# Patient Record
Sex: Female | Born: 1955 | Race: Black or African American | Hispanic: No | Marital: Single | Smoking: Never smoker
Health system: Southern US, Community
[De-identification: ages and names within clinical notes are randomized; demographics above are authoritative.]

## PROBLEM LIST (undated history)

## (undated) DIAGNOSIS — E079 Disorder of thyroid, unspecified: Secondary | ICD-10-CM

## (undated) DIAGNOSIS — M1990 Unspecified osteoarthritis, unspecified site: Secondary | ICD-10-CM

## (undated) HISTORY — PX: ABDOMINAL HYSTERECTOMY: SHX81

---

## 1998-12-29 ENCOUNTER — Other Ambulatory Visit: Admission: RE | Admit: 1998-12-29 | Discharge: 1998-12-29 | Payer: Self-pay | Admitting: Obstetrics and Gynecology

## 1999-02-18 ENCOUNTER — Ambulatory Visit (HOSPITAL_COMMUNITY): Admission: RE | Admit: 1999-02-18 | Discharge: 1999-02-18 | Payer: Self-pay | Admitting: Obstetrics and Gynecology

## 1999-02-18 ENCOUNTER — Encounter: Payer: Self-pay | Admitting: Obstetrics and Gynecology

## 1999-02-22 ENCOUNTER — Inpatient Hospital Stay (HOSPITAL_COMMUNITY): Admission: RE | Admit: 1999-02-22 | Discharge: 1999-02-24 | Payer: Self-pay | Admitting: Obstetrics and Gynecology

## 2000-04-19 ENCOUNTER — Emergency Department (HOSPITAL_COMMUNITY): Admission: EM | Admit: 2000-04-19 | Discharge: 2000-04-19 | Payer: Self-pay | Admitting: *Deleted

## 2000-04-27 ENCOUNTER — Emergency Department (HOSPITAL_COMMUNITY): Admission: EM | Admit: 2000-04-27 | Discharge: 2000-04-27 | Payer: Self-pay | Admitting: Emergency Medicine

## 2001-04-10 ENCOUNTER — Emergency Department (HOSPITAL_COMMUNITY): Admission: EM | Admit: 2001-04-10 | Discharge: 2001-04-10 | Payer: Self-pay | Admitting: Emergency Medicine

## 2004-03-19 ENCOUNTER — Emergency Department (HOSPITAL_COMMUNITY): Admission: EM | Admit: 2004-03-19 | Discharge: 2004-03-19 | Payer: Self-pay | Admitting: Family Medicine

## 2004-07-18 DIAGNOSIS — H0520 Unspecified exophthalmos: Secondary | ICD-10-CM | POA: Insufficient documentation

## 2004-10-28 ENCOUNTER — Ambulatory Visit: Payer: Self-pay | Admitting: Family Medicine

## 2004-11-11 ENCOUNTER — Ambulatory Visit: Payer: Self-pay | Admitting: Family Medicine

## 2004-11-17 ENCOUNTER — Ambulatory Visit: Payer: Self-pay | Admitting: *Deleted

## 2004-12-01 ENCOUNTER — Ambulatory Visit: Payer: Self-pay | Admitting: Family Medicine

## 2004-12-22 ENCOUNTER — Ambulatory Visit: Payer: Self-pay | Admitting: Family Medicine

## 2005-01-31 ENCOUNTER — Encounter (HOSPITAL_COMMUNITY): Admission: RE | Admit: 2005-01-31 | Discharge: 2005-05-01 | Payer: Self-pay | Admitting: Family Medicine

## 2005-12-17 ENCOUNTER — Emergency Department (HOSPITAL_COMMUNITY): Admission: EM | Admit: 2005-12-17 | Discharge: 2005-12-17 | Payer: Self-pay | Admitting: Emergency Medicine

## 2005-12-22 ENCOUNTER — Ambulatory Visit: Payer: Self-pay | Admitting: Family Medicine

## 2006-01-01 ENCOUNTER — Ambulatory Visit: Payer: Self-pay | Admitting: Family Medicine

## 2006-01-12 ENCOUNTER — Ambulatory Visit: Payer: Self-pay | Admitting: Family Medicine

## 2006-02-23 ENCOUNTER — Ambulatory Visit: Payer: Self-pay | Admitting: Family Medicine

## 2006-02-23 LAB — CONVERTED CEMR LAB
HCT: 37.5 %
Hemoglobin: 11.8 g/dL
LDL Cholesterol: 109 mg/dL
MCV: 95.4 fL
VLDL: 20 mg/dL
WBC: 8.3 10*3/uL

## 2006-02-28 ENCOUNTER — Ambulatory Visit (HOSPITAL_COMMUNITY): Admission: RE | Admit: 2006-02-28 | Discharge: 2006-02-28 | Payer: Self-pay | Admitting: Family Medicine

## 2006-12-31 ENCOUNTER — Ambulatory Visit: Payer: Self-pay | Admitting: Family Medicine

## 2007-08-01 DIAGNOSIS — F1010 Alcohol abuse, uncomplicated: Secondary | ICD-10-CM | POA: Insufficient documentation

## 2007-08-01 DIAGNOSIS — E0500 Thyrotoxicosis with diffuse goiter without thyrotoxic crisis or storm: Secondary | ICD-10-CM | POA: Insufficient documentation

## 2007-08-01 DIAGNOSIS — E785 Hyperlipidemia, unspecified: Secondary | ICD-10-CM

## 2007-08-01 DIAGNOSIS — R87610 Atypical squamous cells of undetermined significance on cytologic smear of cervix (ASC-US): Secondary | ICD-10-CM

## 2007-08-01 DIAGNOSIS — L259 Unspecified contact dermatitis, unspecified cause: Secondary | ICD-10-CM

## 2007-08-01 DIAGNOSIS — D6489 Other specified anemias: Secondary | ICD-10-CM | POA: Insufficient documentation

## 2007-08-01 DIAGNOSIS — E039 Hypothyroidism, unspecified: Secondary | ICD-10-CM | POA: Insufficient documentation

## 2007-08-07 ENCOUNTER — Encounter (INDEPENDENT_AMBULATORY_CARE_PROVIDER_SITE_OTHER): Payer: Self-pay | Admitting: *Deleted

## 2007-11-22 ENCOUNTER — Telehealth (INDEPENDENT_AMBULATORY_CARE_PROVIDER_SITE_OTHER): Payer: Self-pay | Admitting: Internal Medicine

## 2007-12-02 ENCOUNTER — Ambulatory Visit: Payer: Self-pay | Admitting: Family Medicine

## 2007-12-06 ENCOUNTER — Ambulatory Visit (HOSPITAL_COMMUNITY): Admission: RE | Admit: 2007-12-06 | Discharge: 2007-12-06 | Payer: Self-pay | Admitting: Family Medicine

## 2007-12-09 LAB — CONVERTED CEMR LAB
ALT: 29 units/L (ref 0–35)
AST: 46 units/L — ABNORMAL HIGH (ref 0–37)
Alkaline Phosphatase: 89 units/L (ref 39–117)
BUN: 24 mg/dL — ABNORMAL HIGH (ref 6–23)
Basophils Absolute: 0 10*3/uL (ref 0.0–0.1)
Basophils Relative: 0 % (ref 0–1)
Calcium: 9.6 mg/dL (ref 8.4–10.5)
Chloride: 105 meq/L (ref 96–112)
Creatinine, Ser: 1.27 mg/dL — ABNORMAL HIGH (ref 0.40–1.20)
Eosinophils Absolute: 0.1 10*3/uL (ref 0.0–0.7)
MCHC: 31.3 g/dL (ref 30.0–36.0)
MCV: 91.4 fL (ref 78.0–100.0)
Monocytes Relative: 6 % (ref 3–12)
Neutro Abs: 4.7 10*3/uL (ref 1.7–7.7)
Neutrophils Relative %: 53 % (ref 43–77)
Potassium: 3.9 meq/L (ref 3.5–5.3)
RDW: 15.4 % (ref 11.5–15.5)

## 2008-01-31 ENCOUNTER — Encounter (INDEPENDENT_AMBULATORY_CARE_PROVIDER_SITE_OTHER): Payer: Self-pay | Admitting: Internal Medicine

## 2008-01-31 ENCOUNTER — Ambulatory Visit: Payer: Self-pay | Admitting: Internal Medicine

## 2008-01-31 LAB — CONVERTED CEMR LAB: Whiff Test: POSITIVE

## 2008-02-03 ENCOUNTER — Ambulatory Visit: Payer: Self-pay | Admitting: Internal Medicine

## 2008-02-06 DIAGNOSIS — K089 Disorder of teeth and supporting structures, unspecified: Secondary | ICD-10-CM | POA: Insufficient documentation

## 2008-02-08 ENCOUNTER — Encounter (INDEPENDENT_AMBULATORY_CARE_PROVIDER_SITE_OTHER): Payer: Self-pay | Admitting: Internal Medicine

## 2008-02-08 LAB — CONVERTED CEMR LAB
ALT: 19 units/L (ref 0–35)
AST: 21 units/L (ref 0–37)
Albumin: 4.4 g/dL (ref 3.5–5.2)
Alkaline Phosphatase: 102 units/L (ref 39–117)
Calcium: 8.9 mg/dL (ref 8.4–10.5)
Chlamydia, DNA Probe: NEGATIVE
Chloride: 102 meq/L (ref 96–112)
Collection Interval-CRCL: 24 hr
Creatinine 24 HR UR: 2020 mg/24hr — ABNORMAL HIGH (ref 700–1800)
Creatinine Clearance: 169 mL/min — ABNORMAL HIGH (ref 75–115)
Creatinine, Urine: 115.5 mg/dL
GC Probe Amp, Genital: NEGATIVE
LDL Cholesterol: 66 mg/dL (ref 0–99)
Potassium: 4.2 meq/L (ref 3.5–5.3)
Sodium: 139 meq/L (ref 135–145)
TSH: 19.926 microintl units/mL — ABNORMAL HIGH (ref 0.350–5.50)
Total Protein: 8.6 g/dL — ABNORMAL HIGH (ref 6.0–8.3)
Triglycerides: 61 mg/dL (ref <150)

## 2008-02-16 ENCOUNTER — Encounter (INDEPENDENT_AMBULATORY_CARE_PROVIDER_SITE_OTHER): Payer: Self-pay | Admitting: Internal Medicine

## 2008-03-19 ENCOUNTER — Telehealth (INDEPENDENT_AMBULATORY_CARE_PROVIDER_SITE_OTHER): Payer: Self-pay | Admitting: *Deleted

## 2008-06-09 ENCOUNTER — Telehealth (INDEPENDENT_AMBULATORY_CARE_PROVIDER_SITE_OTHER): Payer: Self-pay | Admitting: Internal Medicine

## 2008-07-24 ENCOUNTER — Ambulatory Visit: Payer: Self-pay | Admitting: Internal Medicine

## 2008-08-13 ENCOUNTER — Encounter (INDEPENDENT_AMBULATORY_CARE_PROVIDER_SITE_OTHER): Payer: Self-pay | Admitting: Internal Medicine

## 2009-03-26 ENCOUNTER — Ambulatory Visit: Payer: Self-pay | Admitting: Internal Medicine

## 2009-04-09 ENCOUNTER — Ambulatory Visit (HOSPITAL_COMMUNITY): Admission: RE | Admit: 2009-04-09 | Discharge: 2009-04-09 | Payer: Self-pay | Admitting: Internal Medicine

## 2009-10-20 ENCOUNTER — Telehealth (INDEPENDENT_AMBULATORY_CARE_PROVIDER_SITE_OTHER): Payer: Self-pay | Admitting: *Deleted

## 2009-12-31 ENCOUNTER — Ambulatory Visit: Payer: Self-pay | Admitting: Internal Medicine

## 2009-12-31 ENCOUNTER — Telehealth (INDEPENDENT_AMBULATORY_CARE_PROVIDER_SITE_OTHER): Payer: Self-pay | Admitting: Internal Medicine

## 2009-12-31 LAB — CONVERTED CEMR LAB: TSH: 1.249 microintl units/mL (ref 0.350–4.500)

## 2010-01-04 ENCOUNTER — Encounter (INDEPENDENT_AMBULATORY_CARE_PROVIDER_SITE_OTHER): Payer: Self-pay | Admitting: Internal Medicine

## 2010-01-04 LAB — CONVERTED CEMR LAB
ALT: 17 units/L (ref 0–35)
BUN: 17 mg/dL (ref 6–23)
CO2: 24 meq/L (ref 19–32)
Calcium: 9.8 mg/dL (ref 8.4–10.5)
Chloride: 103 meq/L (ref 96–112)
Cholesterol: 147 mg/dL (ref 0–200)
Creatinine, Ser: 0.66 mg/dL (ref 0.40–1.20)
Glucose, Bld: 89 mg/dL (ref 70–99)
Total CHOL/HDL Ratio: 2.2
Triglycerides: 63 mg/dL (ref <150)

## 2010-01-11 ENCOUNTER — Encounter (INDEPENDENT_AMBULATORY_CARE_PROVIDER_SITE_OTHER): Payer: Self-pay | Admitting: Internal Medicine

## 2010-02-27 ENCOUNTER — Telehealth (INDEPENDENT_AMBULATORY_CARE_PROVIDER_SITE_OTHER): Payer: Self-pay | Admitting: *Deleted

## 2010-03-15 ENCOUNTER — Ambulatory Visit: Payer: Self-pay | Admitting: Internal Medicine

## 2010-03-15 LAB — CONVERTED CEMR LAB
Basophils Absolute: 0 10*3/uL (ref 0.0–0.1)
Eosinophils Relative: 2 % (ref 0–5)
Lymphocytes Relative: 40 % (ref 12–46)
Lymphs Abs: 2.9 10*3/uL (ref 0.7–4.0)
MCHC: 31.2 g/dL (ref 30.0–36.0)
MCV: 89.5 fL (ref 78.0–100.0)
Neutro Abs: 3.8 10*3/uL (ref 1.7–7.7)
Platelets: 270 10*3/uL (ref 150–400)
RBC: 4.3 M/uL (ref 3.87–5.11)

## 2010-04-17 ENCOUNTER — Encounter (INDEPENDENT_AMBULATORY_CARE_PROVIDER_SITE_OTHER): Payer: Self-pay | Admitting: Internal Medicine

## 2010-05-03 ENCOUNTER — Ambulatory Visit (HOSPITAL_COMMUNITY): Admission: RE | Admit: 2010-05-03 | Discharge: 2010-05-03 | Payer: Self-pay | Admitting: Internal Medicine

## 2010-12-11 ENCOUNTER — Encounter: Payer: Self-pay | Admitting: Family Medicine

## 2010-12-20 NOTE — Letter (Signed)
Summary: *HSN Results Follow up  [facility]  [address] [place], [place] [address]   Phone: [phone]  Fax: [phone]      04/17/2010   Alison Harris [address] [place], [place]  [address]   Dear  [name],                            ____S.Drinkard,FNP   ____D. Gore,FNP       ____B. McPherson,MD   ____V. Rankins,MD    __X__E. Leslye Puccini,MD    ____N. Daphine Deutscher, FNP  ____D. Reche Dixon, MD    ____K. Philipp Deputy, MD    ____Other     This letter is to inform you that your recent test(s):  _______Pap Smear    ____X___Lab Test     _______X-ray    ___X____ is within acceptable limits  _______ requires a medication change  _______ requires a follow-up lab visit  _______ requires a follow-up visit with your provider   Comments:  Your bloodwork was fine.  You need to see me.  I thought you had an appt. in May.  May is over.       _________________________________________________________ If you have any questions, please contact our office                     Sincerely,  Julieanne Manson MD [facility]

## 2010-12-20 NOTE — Letter (Signed)
Summary: Lipid Letter  [facility]  [address] [place], [place] [address]   Phone: [phone]  Fax: [phone]    01/11/2010  Alison Harris [address] [place], [place]  [address]  Dear Alison Harris:  We have carefully reviewed your last lipid profile from 01/04/2010 and the results are noted below with a summary of recommendations for lipid management.    Cholesterol:       147     Goal: <200   HDL "good" Cholesterol:   67     Goal: >45   LDL "bad" Cholesterol:   67     Goal: <100   Triglycerides:       63     Goal: <15-    Cholesterol is excellent.  Stay on Crestor.  You should have labs done regarding you liver enzymes twice yearly and your cholesterol checked once yearly at least.  Please call and make sure you have a liver check (lab) in 6 months.    TLC Diet (Therapeutic Lifestyle Change): Saturated Fats & Transfatty acids should be kept < 7% of total calories ***Reduce Saturated Fats Polyunstaurated Fat can be up to 10% of total calories Monounsaturated Fat Fat can be up to 20% of total calories Total Fat should be no greater than 25-35% of total calories Carbohydrates should be 50-60% of total calories Protein should be approximately 15% of total calories Fiber should be at least 20-30 grams a day ***Increased fiber may help lower LDL Total Cholesterol should be < 200mg /day Consider adding plant stanol/sterols to diet (example: Benacol spread) ***A higher intake of unsaturated fat may reduce Triglycerides and Increase HDL    Adjunctive Measures (may lower LIPIDS and reduce risk of Heart Attack) include: Aerobic Exercise (20-30 minutes 3-4 times a week) Limit Alcohol Consumption Weight Reduction Aspirin 75-81 mg a day by mouth (if not allergic or contraindicated) Dietary Fiber 20-30 grams a day by mouth     Current Medications: 1)    Vistaril 25 Mg  Caps (Hydroxyzine pamoate) .... 1/2  to 1 capsule by mouth as needed for itching 2)    Crestor 10 Mg  Tabs (Rosuvastatin calcium) .... Take 1 tablet by mouth once a day 3)    Triamcinolone Acetonide 0.1 % Crea (Triamcinolone acetonide) .... Apply small amount to affected area and rub in well two times a day 4)    Metronidazole 500 Mg  Tabs (Metronidazole) .Marland Kitchen.. 1 tab by mouth two times a day for 7 days 5)    Synthroid 100 Mcg  Tabs (Levothyroxine sodium) .... Take 1 tablet by mouth once a day  will need thyroid  test in early july 2009.  If you have any questions, please call. We appreciate being able to work with you.   Sincerely,    [facility] Julieanne Manson MD

## 2010-12-20 NOTE — Progress Notes (Signed)
Summary: medication request   Phone Note Call from Patient   Summary of Call: pt came in for lab stating she is out of crestor and synthroid and she use [facility]... Initial call taken by: [place] Shannon,  December 31, 2009 8:21 AM  Follow-up for Phone Call        Pt. has been out of Crestor from what I can tell since beginning of 2010--please call her and find out when she last took her Crestor and please confirm her last fill from pharmacy. If she was indeed taking Crestor for the past month prior to blood draw on the 11th, please add CMET, FLP to those labs.    If she was not, please fill the Crestor for 2 months only and schedule her to come in in 6 weeks for FLP and CMET. I will fill thyroid hormone Follow-up by: Julieanne Manson MD,  January 03, 2010 1:46 PM  Additional Follow-up for Phone Call Additional follow up Details #1::        Left message on answering machine for pt to return call  Per Cathrine Muster las filled 01/26/2009. Additional Follow-up by: Vesta Mixer CMA,  January 04, 2010 10:21 AM    Additional Follow-up for Phone Call Additional follow up Details #2::    pt stated that she has been taking her crestor on a regular basis and has not missed any doses. she states that meds were being delivered directly to her home by the drug company. Did advised that we would add blood work and then provider would review labs to make any adjustments necessary and at that point we would contact pt to regarding refill information. pt notified that rx was sent to pharmacy for synthroid.. contacted [facility])CMET AND FLP ADDED TO PANEL.Marland KitchenMikey College CMA  January 04, 2010 11:42 AM   Sent fax to [facility] pharmacy to check if this is the case with Burna Mortimer.  Julieanne Manson MD  January 11, 2010 5:39 PM  Spoke with Wanda--pt. has been getting directly to her home.  Julieanne Manson MD  January 18, 2010 9:57 AM   Prescriptions: CRESTOR 10 MG TABS (ROSUVASTATIN CALCIUM) Take 1  tablet by mouth once a day  #30 x 11   Entered and Authorized by:   Julieanne Manson MD   Signed by:   Julieanne Manson MD on 01/18/2010   Method used:   Faxed to ...       [facility] (retail)       [address] [place], [place]  [address]       Ph: [phone]       Fax: [phone]   RxID:   [id number] CRESTOR 10 MG TABS (ROSUVASTATIN CALCIUM) Take 1 tablet by mouth once a day  #30 x 2   Entered and Authorized by:   Julieanne Manson MD   Signed by:   Julieanne Manson MD on 01/11/2010   Method used:   Faxed to ...       [facility] (retail)       [address] [place], [place]  [address]       Ph: [phone]       Fax: [phone]   RxID:   [id number] SYNTHROID 100 MCG  TABS (LEVOTHYROXINE SODIUM) Take 1 tablet by mouth once a day  Will need thyroid  test in early July 2009.  #30 x  11   Entered and Authorized by:   Julieanne Manson MD   Signed by:   Julieanne Manson MD on 01/03/2010   Method used:   Faxed to ...       [facility] (retail)       [address] [place], [place]  [address]       Ph: [phone]       Fax: [phone]   RxID:   [id number]

## 2010-12-20 NOTE — Progress Notes (Signed)
   Phone Note Outgoing Call   Summary of Call: Pt. needs to be seen at least in May--has not been seen since 03/2009.  Will need follow up CBC then--never followed up for that specifically after 5/10 visit as recommended. Initial call taken by: Julieanne Manson MD,  February 27, 2010 6:07 PM  Follow-up for Phone Call        Called pt. and had her schedule a CPP w/GK in May. Also, will f/u on CBC. ....Marland KitchenMarland KitchenMarland Kitchen Chauncy Passy SMA   February 28, 2010 10:33 AM

## 2011-03-28 ENCOUNTER — Other Ambulatory Visit (HOSPITAL_COMMUNITY): Payer: Self-pay | Admitting: Internal Medicine

## 2011-03-28 DIAGNOSIS — Z1231 Encounter for screening mammogram for malignant neoplasm of breast: Secondary | ICD-10-CM

## 2011-05-11 ENCOUNTER — Ambulatory Visit (HOSPITAL_COMMUNITY)
Admission: RE | Admit: 2011-05-11 | Discharge: 2011-05-11 | Disposition: A | Discharge status: Home / Self Care | Payer: Self-pay | Source: Ambulatory Visit | Attending: Internal Medicine | Admitting: Internal Medicine

## 2011-05-11 DIAGNOSIS — Z1231 Encounter for screening mammogram for malignant neoplasm of breast: Secondary | ICD-10-CM | POA: Insufficient documentation

## 2011-08-01 ENCOUNTER — Inpatient Hospital Stay (INDEPENDENT_AMBULATORY_CARE_PROVIDER_SITE_OTHER)
Admission: RE | Admit: 2011-08-01 | Discharge: 2011-08-01 | Disposition: A | Discharge status: Home / Self Care | Payer: Self-pay | Source: Ambulatory Visit | Attending: Emergency Medicine | Admitting: Emergency Medicine

## 2011-08-01 DIAGNOSIS — K047 Periapical abscess without sinus: Secondary | ICD-10-CM

## 2012-01-10 ENCOUNTER — Other Ambulatory Visit: Payer: Self-pay | Admitting: Family Medicine

## 2012-01-10 DIAGNOSIS — N644 Mastodynia: Secondary | ICD-10-CM

## 2012-01-26 ENCOUNTER — Other Ambulatory Visit: Payer: Self-pay | Admitting: Family Medicine

## 2012-01-26 ENCOUNTER — Ambulatory Visit
Admission: RE | Admit: 2012-01-26 | Discharge: 2012-01-26 | Disposition: A | Discharge status: Home / Self Care | Payer: Self-pay | Source: Ambulatory Visit | Attending: Family Medicine | Admitting: Family Medicine

## 2012-01-26 DIAGNOSIS — N644 Mastodynia: Secondary | ICD-10-CM

## 2012-01-26 IMAGING — MG MM DIAGNOSTIC BILATERAL
4 series · 4 of 4 positions shown · non-contrast
Comparison: [DATE], [DATE], dating back to [DATE].

CLINICAL DATA: Pain between the breasts.  History of benign
excisional biopsies of both breasts.

DIGITAL DIAGNOSTIC BILATERAL MAMMOGRAM WITH CAD

[R CC]
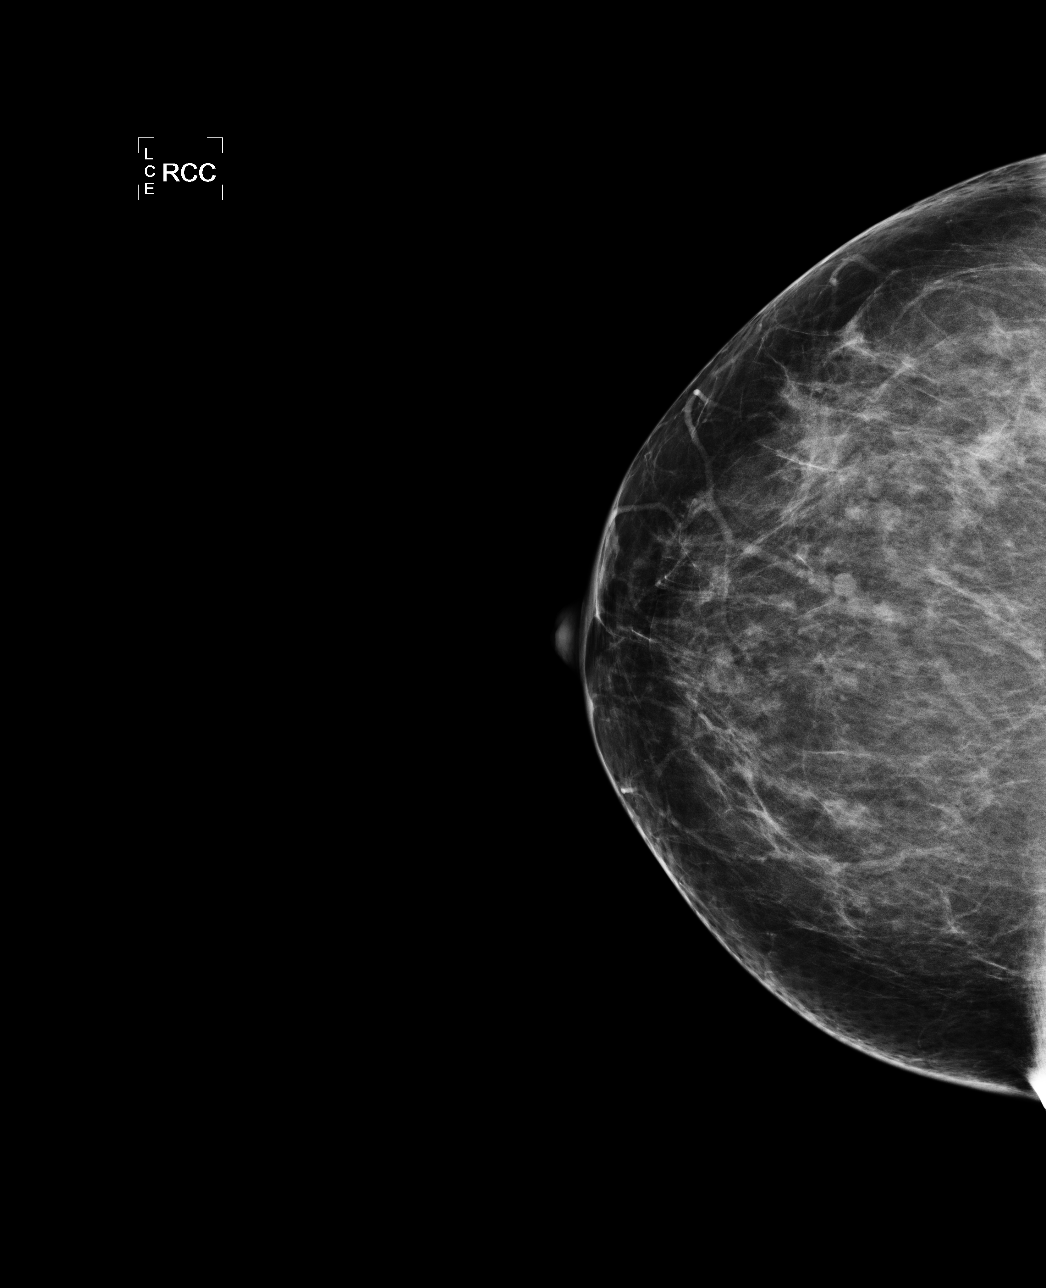

[L CC]
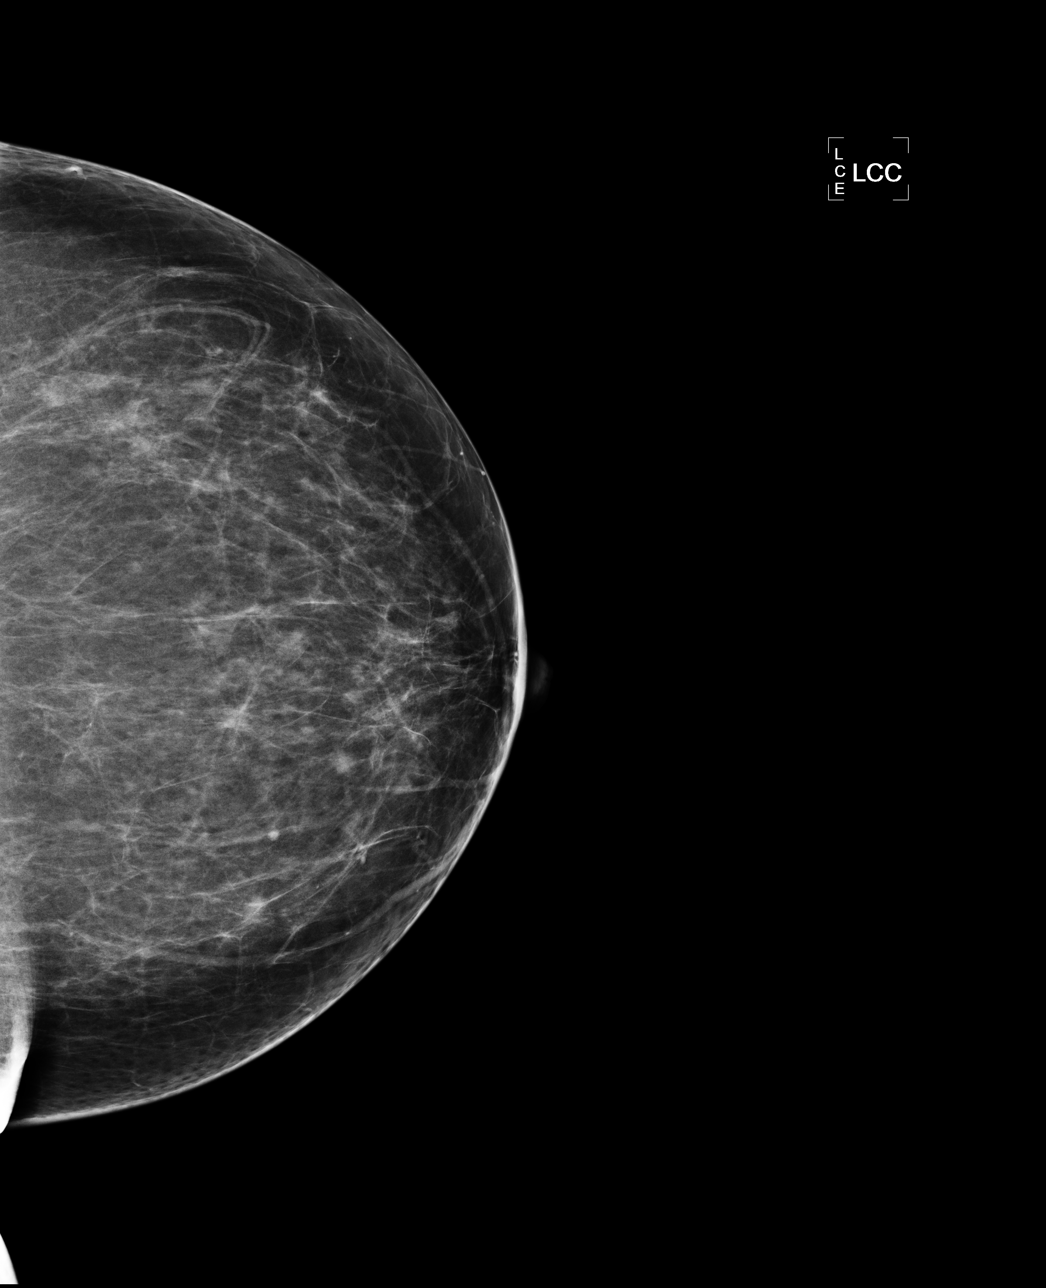

[L MLO]
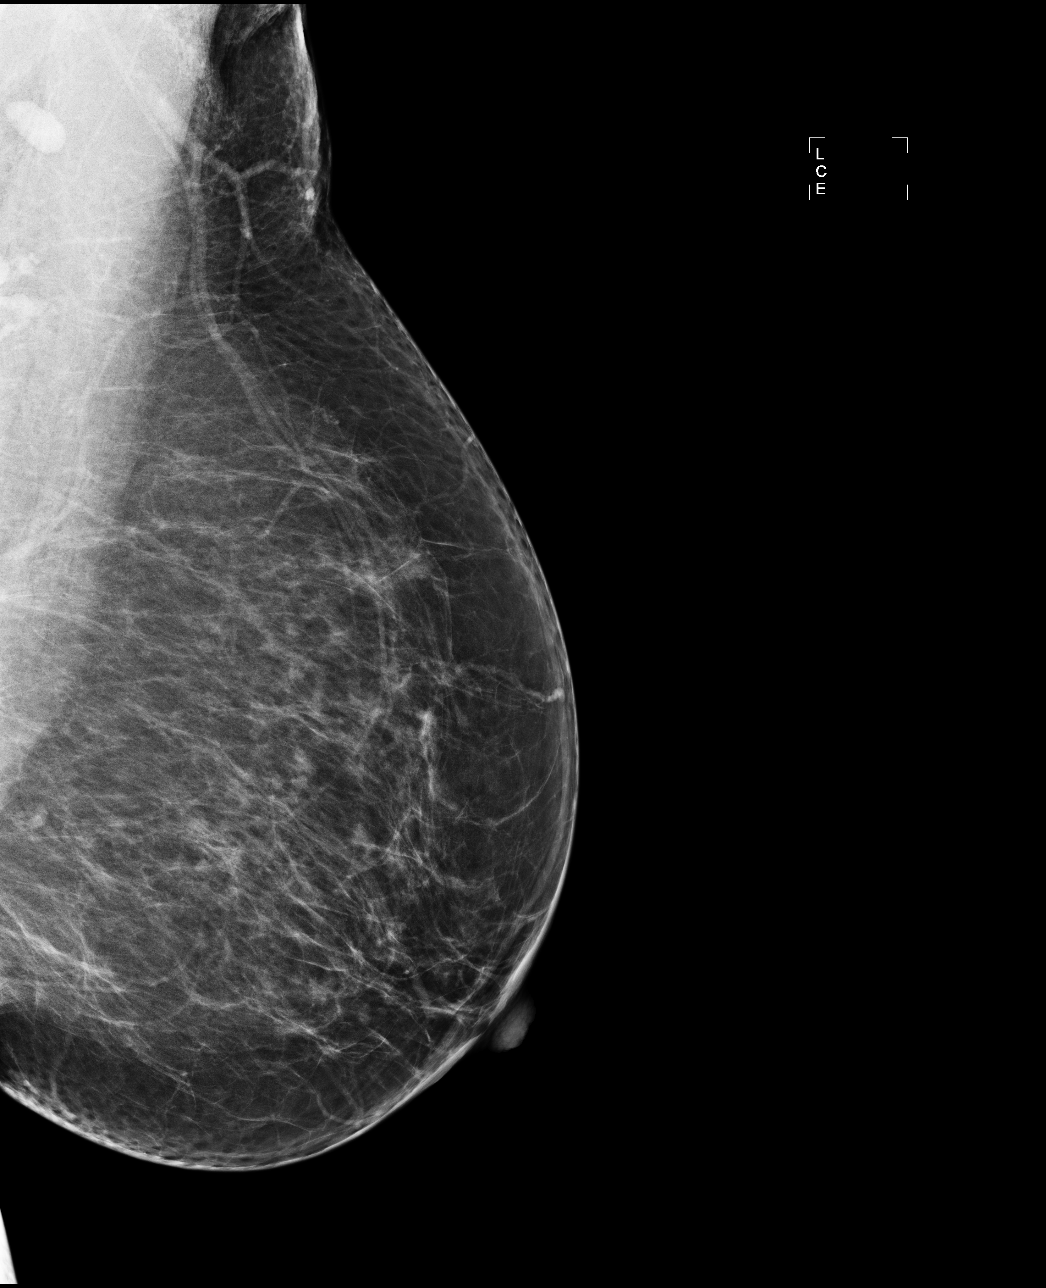

[R MLO]
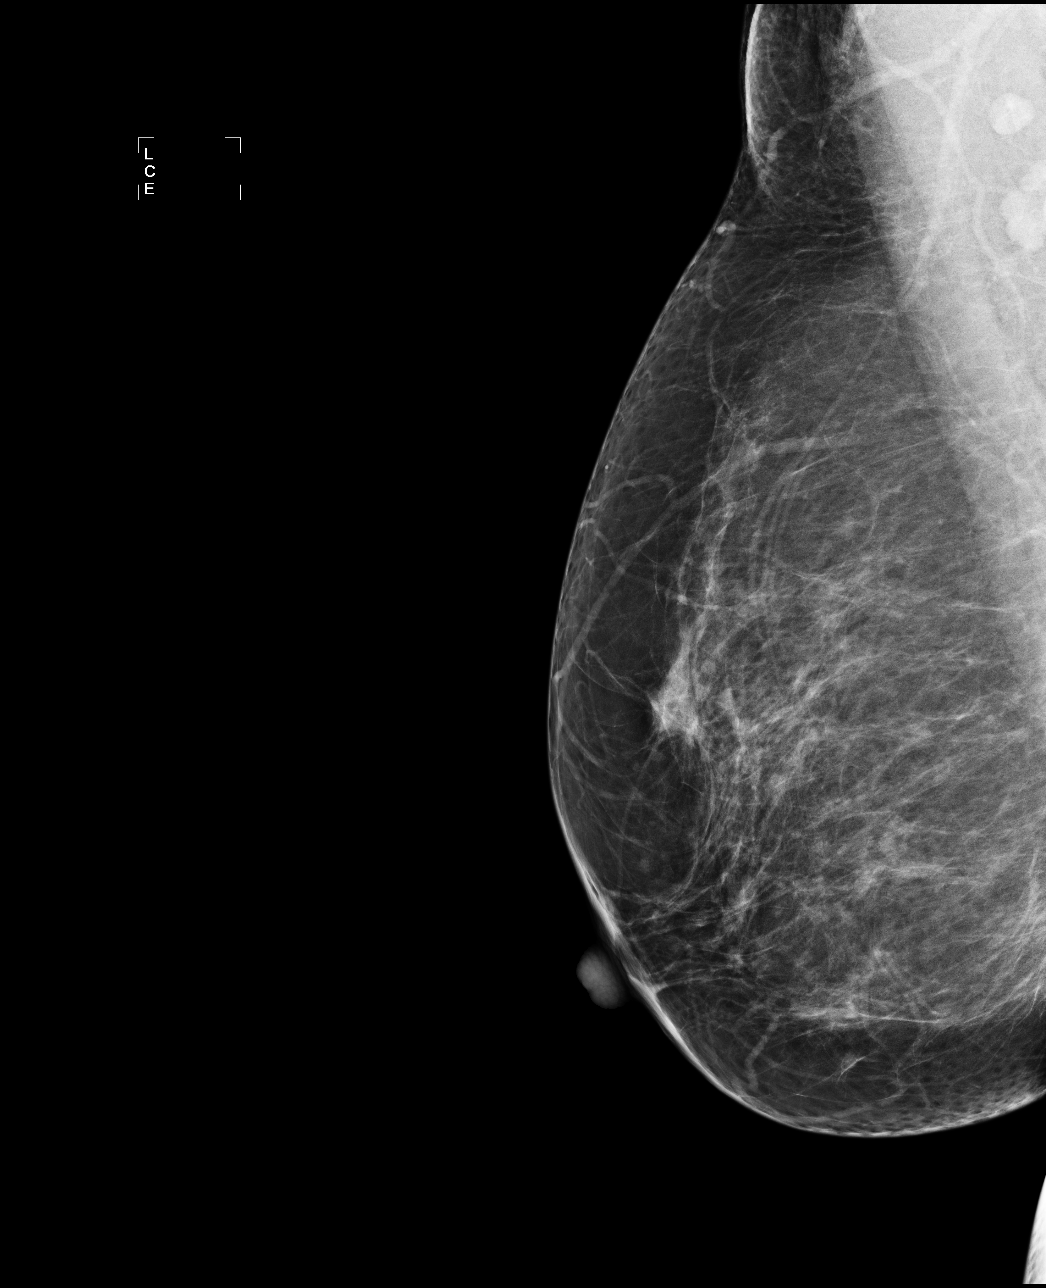

[4 of 4 positions shown; findings below may reference images not displayed]

FINDINGS: CC and MLO views of both breast were obtained.
Scattered fibroglandular parenchymal pattern.  No new suspicious
mass, nonsurgical architectural distortion, or suspicious
calcifications in either breast.
Mammographic images were processed with CAD.
IMPRESSION: No specific mammographic evidence of malignancy.

Recommendation:  Annual bilateral screening mammography in 1 year,
[DATE].  The patient was encouraged to perform monthly self
breast examination and communicate any changes with her primary
physician.  Findings and recommendations were discussed with the
patient and provided in written form at the time of the exam.

BI-RADS CATEGORY 1:  Negative.

## 2017-01-22 ENCOUNTER — Ambulatory Visit (HOSPITAL_COMMUNITY)
Admission: EM | Admit: 2017-01-22 | Discharge: 2017-01-22 | Disposition: A | Discharge status: Home / Self Care | Payer: BLUE CROSS/BLUE SHIELD | Attending: Family Medicine | Admitting: Family Medicine

## 2017-01-22 ENCOUNTER — Encounter (HOSPITAL_COMMUNITY): Payer: Self-pay | Admitting: Family Medicine

## 2017-01-22 DIAGNOSIS — K047 Periapical abscess without sinus: Secondary | ICD-10-CM

## 2017-01-22 HISTORY — DX: Disorder of thyroid, unspecified: E07.9

## 2017-01-22 MED ORDER — CLINDAMYCIN HCL 150 MG PO CAPS: 150.0000 mg | ORAL_CAPSULE | Freq: Four times a day (QID) | ORAL | 0 refills | Status: DC

## 2017-01-22 MED ORDER — IBUPROFEN 800 MG PO TABS: 800.0000 mg | ORAL_TABLET | Freq: Three times a day (TID) | ORAL | 0 refills | Status: DC | PRN

## 2017-01-22 NOTE — ED Provider Notes (Signed)
CSN: [id number]     Arrival date & time 01/22/17  1221 History   None    Chief Complaint  Patient presents with  . Oral Pain   (Consider location/radiation/quality/duration/timing/severity/associated sxs/prior Treatment)   Patient with pain in her mouth on right side. She has dental caries. Patient denies having the money to see the dentist. Denies any fevers, nausea, or vomiting. Indicates feeling a little bit of swelling        Past Medical History:  Diagnosis Date  . Thyroid disease    History reviewed. No pertinent surgical history. History reviewed. No pertinent family history. Social History  Substance Use Topics  . Smoking status: Current Every Day Smoker  . Smokeless tobacco: Never Used  . Alcohol use Not on file   OB History    No data available     Review of Systems  Allergies  Patient has no known allergies.  Home Medications   Prior to Admission medications   Medication Sig Start Date End Date Taking? Authorizing Provider  clindamycin (CLEOCIN) 150 MG capsule Take 1 capsule (150 mg total) by mouth 4 (four) times daily. 01/22/17   Atalya Dano Mayra ReelZahra Tenzin Edelman, MD  ibuprofen (ADVIL,MOTRIN) 800 MG tablet Take 1 tablet (800 mg total) by mouth every 8 (eight) hours as needed. 01/22/17   Darly Massi Mayra ReelZahra Arif Amendola, MD   Meds Ordered and Administered this Visit  Medications - No data to display  BP 131/79 (BP Location: Left Arm)   Pulse 70   Temp 98.8 F (37.1 C) (Oral)   Resp 16   SpO2 96%  No data found.   Physical Exam  Constitutional: She is oriented to person, place, and time. She appears well-developed and well-nourished.  HENT:  Head: Normocephalic.  Right Ear: External ear normal.  Left Ear: External ear normal.  Nose: Nose normal.  Mouth/Throat: Oropharynx is clear and moist. Dental abscesses and dental caries present.    Positive for lymphadenopathy   Eyes: Conjunctivae and EOM are normal. Pupils are equal, round, and reactive to light.  Neck: Normal  range of motion. Neck supple.  Cardiovascular: Normal rate, regular rhythm, normal heart sounds and intact distal pulses.   Pulmonary/Chest: Effort normal and breath sounds normal.  Abdominal: Soft. Bowel sounds are normal.  Musculoskeletal: Normal range of motion.  Neurological: She is alert and oriented to person, place, and time.  Skin: Skin is warm and dry.    Urgent Care Course     Procedures (including critical care time)  Labs Review Labs Reviewed - No data to display  Imaging Review No results found.      MDM   1. Dental abscess    61 year old female presenting for pain in  Mouth with multiple dental caries noted on exam. Patient states she has not been able to see a dentist due to overall cost. Slight swelling noted on right side of face compared to left. Therefore will provide patient with clindamycin for 14 days as well as ibuprofen 800 mg for pain. Follow-up with affordable dentures.      Berton BonAsiyah Zahra Jenisis Harmsen, MD 01/22/17 1258    Kelse Ploch Mayra ReelZahra Milta Croson, MD 01/22/17 [phone]

## 2017-01-22 NOTE — ED Triage Notes (Signed)
Pt here for pain in her gums.

## 2017-01-22 NOTE — Discharge Instructions (Signed)
Please take your clindamycin every 6 hours. For pain control you will need to take Ibuprofen every 8 hours as needed.

## 2018-12-19 ENCOUNTER — Ambulatory Visit (HOSPITAL_COMMUNITY)
Admission: EM | Admit: 2018-12-19 | Discharge: 2018-12-19 | Disposition: A | Discharge status: Home / Self Care | Payer: BLUE CROSS/BLUE SHIELD | Attending: Family Medicine | Admitting: Family Medicine

## 2018-12-19 ENCOUNTER — Ambulatory Visit (INDEPENDENT_AMBULATORY_CARE_PROVIDER_SITE_OTHER): Payer: BLUE CROSS/BLUE SHIELD

## 2018-12-19 ENCOUNTER — Encounter (HOSPITAL_COMMUNITY): Payer: Self-pay | Admitting: Emergency Medicine

## 2018-12-19 DIAGNOSIS — M25561 Pain in right knee: Secondary | ICD-10-CM

## 2018-12-19 MED ORDER — MELOXICAM 15 MG PO TABS: 15.0000 mg | ORAL_TABLET | Freq: Every day | ORAL | 1 refills | Status: DC

## 2018-12-19 NOTE — ED Triage Notes (Signed)
Pt c/o R knee pain for several months

## 2018-12-19 NOTE — Discharge Instructions (Addendum)
Return if any problems.  Schedule to see the Orthopaedist for evaluation °

## 2018-12-19 NOTE — ED Provider Notes (Signed)
[facility]    CSN: [id number] Arrival date & time: 12/19/18  1401     History   Chief Complaint Chief Complaint  Patient presents with  . Knee Pain    HPI Alison Harris is a 63 y.o. female.   The history is provided by the patient. No language interpreter was used.  Knee Pain  Location:  Knee Time since incident:  2 months Injury: no   Knee location:  R knee Pain details:    Quality:  Aching   Radiates to:  Does not radiate   Severity:  Mild   Onset quality:  Gradual   Duration:  2 months   Timing:  Constant Chronicity:  New Foreign body present:  No foreign bodies Relieved by:  Nothing Worsened by:  Nothing Ineffective treatments:  None tried Pt reports increased pain yesterday.  Pt reports leg feels like her leg will give away.   Past Medical History:  Diagnosis Date  . Thyroid disease     Patient Active Problem List   Diagnosis Date Noted  . DENTAL PAIN 02/06/2008  . GRAVE'S DISEASE 08/01/2007  . HYPOTHYROIDISM 08/01/2007  . DYSLIPIDEMIA 08/01/2007  . 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 ALCOHOL USE 08/01/2007  . ECZEMA 08/01/2007  . ASCUS PAP 08/01/2007  . EXOPHTHALMOS 07/18/2004    History reviewed. No pertinent surgical history.  OB History   No obstetric history on file.      Home Medications    Prior to Admission medications   Medication Sig Start Date End Date Taking? Authorizing Provider  ibuprofen (ADVIL,MOTRIN) 800 MG tablet Take 1 tablet (800 mg total) by mouth every 8 (eight) hours as needed. 01/22/17   Mikell, Antionette Poles, MD  meloxicam (MOBIC) 15 MG tablet Take 1 tablet (15 mg total) by mouth daily. 12/19/18 12/19/19  Elson Areas, PA-C    Family History No family history on file.  Social History Social History   Tobacco Use  . Smoking status: Current Every Day Smoker  . Smokeless tobacco: Never Used  Substance Use Topics  . Alcohol use: Not on file  . Drug use: Not on file     Allergies   Patient has no known  allergies.   Review of Systems Review of Systems  All other systems reviewed and are negative.    Physical Exam Triage Vital Signs ED Triage Vitals  Enc Vitals Group     BP 12/19/18 1509 (!) 102/57     Pulse Rate 12/19/18 1509 66     Resp 12/19/18 1509 18     Temp 12/19/18 1509 98.2 F (36.8 C)     Temp src --      SpO2 12/19/18 1509 97 %     Weight --      Height --      Head Circumference --      Peak Flow --      Pain Score 12/19/18 1510 6     Pain Loc --      Pain Edu? --      Excl. in GC? --    No data found.  Updated Vital Signs BP (!) 102/57   Pulse 66   Temp 98.2 F (36.8 C)   Resp 18   SpO2 97%   Visual Acuity Right Eye Distance:   Left Eye Distance:   Bilateral Distance:    Right Eye Near:   Left Eye Near:    Bilateral Near:     Physical Exam  Vitals signs and nursing note reviewed.  Constitutional:      Appearance: She is well-developed.  HENT:     Head: Normocephalic.     Right Ear: Tympanic membrane normal.     Left Ear: Tympanic membrane normal.     Nose: Nose normal.     Mouth/Throat:     Mouth: Mucous membranes are moist.  Eyes:     Pupils: Pupils are equal, round, and reactive to light.  Neck:     Musculoskeletal: Normal range of motion.  Cardiovascular:     Rate and Rhythm: Normal rate.     Pulses: Normal pulses.  Pulmonary:     Effort: Pulmonary effort is normal.  Abdominal:     General: There is no distension.  Musculoskeletal: Normal range of motion.  Skin:    General: Skin is warm.  Neurological:     General: No focal deficit present.     Mental Status: She is alert and oriented to person, place, and time.  Psychiatric:        Mood and Affect: Mood normal.      UC Treatments / Results  Labs (all labs ordered are listed, but only abnormal results are displayed) Labs Reviewed - No data to display  EKG None  Radiology Dg Knee Complete 4 Views Right  Result Date: 12/19/2018 CLINICAL DATA:  Acute right knee  pain without known injury. EXAM: RIGHT KNEE - COMPLETE 4+ VIEW COMPARISON:  None. FINDINGS: No evidence of fracture, dislocation, or joint effusion. No evidence of arthropathy or other focal bone abnormality. Soft tissues are unremarkable. IMPRESSION: Negative. Electronically Signed   By: Lupita Raider, M.D.   On: 12/19/2018 15:58    Procedures Procedures (including critical care time)  Medications Ordered in UC Medications - No data to display  Initial Impression / Assessment and Plan / UC Course  I have reviewed the triage vital signs and the nursing notes.  Pertinent labs & imaging results that were available during my care of the patient were reviewed by me and considered in my medical decision making (see chart for details).      Final Clinical Impressions(s) / UC Diagnoses   Final diagnoses:  Acute pain of right knee     Discharge Instructions     Return if any problems.  Schedule to see the Orthopaedist for evaluation   ED Prescriptions    Medication Sig Dispense Auth. Provider   meloxicam (MOBIC) 15 MG tablet Take 1 tablet (15 mg total) by mouth daily. 30 tablet Elson Areas, [place]     Controlled Substance Prescriptions [place] Controlled Substance Registry consulted? Not Applicable  An After Visit Summary was printed and given to the patient.    Elson Areas, [place] 12/19/18 2045

## 2019-07-28 ENCOUNTER — Encounter (HOSPITAL_COMMUNITY): Payer: Self-pay

## 2019-07-28 ENCOUNTER — Ambulatory Visit (HOSPITAL_COMMUNITY)
Admission: EM | Admit: 2019-07-28 | Discharge: 2019-07-28 | Disposition: A | Discharge status: Home / Self Care | Payer: BC Managed Care – PPO

## 2019-07-28 ENCOUNTER — Other Ambulatory Visit: Payer: Self-pay

## 2019-07-28 DIAGNOSIS — M79671 Pain in right foot: Secondary | ICD-10-CM

## 2019-07-28 HISTORY — DX: Unspecified osteoarthritis, unspecified site: M19.90

## 2019-07-28 MED ORDER — IBUPROFEN 800 MG PO TABS: 800.0000 mg | ORAL_TABLET | Freq: Three times a day (TID) | ORAL | 0 refills | Status: AC | PRN

## 2019-07-28 NOTE — ED Provider Notes (Signed)
[facility]    CSN: [id number] Arrival date & time: 07/28/19  [id number]      History   Chief Complaint Chief Complaint  Patient presents with  . Foot Pain    HPI Alison Harris is a 63 y.o. female.   Presents with right heel pain x3 days.  She denies falls, injury, new exercise, or other known cause for her pain.  She denies decreased sensation, paresthesias, weakness, or other symptoms.  The history is provided by the patient.    Past Medical History:  Diagnosis Date  . Arthritis   . Thyroid disease     Patient Active Problem List   Diagnosis Date Noted  . DENTAL PAIN 02/06/2008  . GRAVE'S DISEASE 08/01/2007  . HYPOTHYROIDISM 08/01/2007  . DYSLIPIDEMIA 08/01/2007  . 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 ALCOHOL USE 08/01/2007  . ECZEMA 08/01/2007  . ASCUS PAP 08/01/2007  . EXOPHTHALMOS 07/18/2004    History reviewed. No pertinent surgical history.  OB History   No obstetric history on file.      Home Medications    Prior to Admission medications   Medication Sig Start Date End Date Taking? Authorizing Provider  diclofenac (VOLTAREN) 75 MG EC tablet Take 75 mg by mouth 2 (two) times daily.   Yes [provider]  ibuprofen (ADVIL) 800 MG tablet Take 1 tablet (800 mg total) by mouth every 8 (eight) hours as needed. 07/28/19   Mickie Bailate, Shaden Higley H, NP  meloxicam (MOBIC) 15 MG tablet Take 1 tablet (15 mg total) by mouth daily. 12/19/18 12/19/19  Elson AreasSofia, Leslie K, PA-C    Family History Family History  Problem Relation Age of Onset  . Bronchitis Mother     Social History Social History   Tobacco Use  . Smoking status: Never Smoker  . Smokeless tobacco: Never Used  Substance Use Topics  . Alcohol use: Yes  . Drug use: Not on file     Allergies   Patient has no known allergies.   Review of Systems Review of Systems  Constitutional: Negative for chills and fever.  HENT: Negative for ear pain and sore throat.   Eyes: Negative for pain and visual  disturbance.  Respiratory: Negative for cough and shortness of breath.   Cardiovascular: Negative for chest pain and palpitations.  Gastrointestinal: Negative for abdominal pain and vomiting.  Genitourinary: Negative for dysuria and hematuria.  Musculoskeletal: Negative for arthralgias and back pain.  Skin: Negative for color change and rash.  Neurological: Negative for seizures, syncope, weakness and numbness.  All other systems reviewed and are negative.    Physical Exam Triage Vital Signs ED Triage Vitals  Enc Vitals Group     BP 07/28/19 1046 127/63     Pulse Rate 07/28/19 1046 (!) 57     Resp 07/28/19 1046 14     Temp 07/28/19 1046 98.1 F (36.7 C)     Temp Source 07/28/19 1046 Oral     SpO2 07/28/19 1046 99 %     Weight --      Height --      Head Circumference --      Peak Flow --      Pain Score 07/28/19 1042 7     Pain Loc --      Pain Edu? --      Excl. in GC? --    No data found.  Updated Vital Signs BP 127/63 (BP Location: Left Arm)   Pulse (!) 57   Temp  98.1 F (36.7 C) (Oral)   Resp 14   SpO2 99%   Visual Acuity Right Eye Distance:   Left Eye Distance:   Bilateral Distance:    Right Eye Near:   Left Eye Near:    Bilateral Near:     Physical Exam Vitals signs and nursing note reviewed.  Constitutional:      General: She is not in acute distress.    Appearance: She is well-developed.  HENT:     Head: Normocephalic and atraumatic.  Eyes:     Conjunctiva/sclera: Conjunctivae normal.  Neck:     Musculoskeletal: Neck supple.  Cardiovascular:     Rate and Rhythm: Normal rate and regular rhythm.     Heart sounds: No murmur.  Pulmonary:     Effort: Pulmonary effort is normal. No respiratory distress.     Breath sounds: Normal breath sounds.  Abdominal:     Palpations: Abdomen is soft.     Tenderness: There is no abdominal tenderness.  Musculoskeletal:        General: Tenderness present. No swelling or deformity.       Feet:  Skin:     General: Skin is warm and dry.     Capillary Refill: Capillary refill takes less than 2 seconds.     Findings: No bruising, erythema, lesion or rash.  Neurological:     General: No focal deficit present.     Mental Status: She is alert and oriented to person, place, and time.     Sensory: No sensory deficit.     Motor: No weakness.     Gait: Gait normal.      UC Treatments / Results  Labs (all labs ordered are listed, but only abnormal results are displayed) Labs Reviewed - No data to display  EKG   Radiology No results found.  Procedures Procedures (including critical care time)  Medications Ordered in UC Medications - No data to display  Initial Impression / Assessment and Plan / UC Course  I have reviewed the triage vital signs and the nursing notes.  Pertinent labs & imaging results that were available during my care of the patient were reviewed by me and considered in my medical decision making (see chart for details).    Right heel pain.  Treating with ibuprofen.  Instructed patient to wear the Ace wrap and, Ortho shoe as needed for comfort.  Instructed her to rest and elevate her foot as she is able.  Discussed that she can use ice packs 2-3 times a day for up to 20 minutes.  Instructed patient to follow-up with orthopedic if her pain continues.  Patient agrees with plan of care.     Final Clinical Impressions(s) / UC Diagnoses   Final diagnoses:  Pain of right heel     Discharge Instructions     Take ibuprofen as prescribed as needed.    Wear the Ace wrap and use the Ortho shoe as needed for comfort.    Rest and elevate your foot.  You can apply ice packs 2-3 times a day for up to 20 minutes.    Follow-up with the orthopedic listed if your pain continues.        ED Prescriptions    Medication Sig Dispense Auth. Provider   ibuprofen (ADVIL) 800 MG tablet Take 1 tablet (800 mg total) by mouth every 8 (eight) hours as needed. 30 tablet Sharion Balloon,  NP     Controlled Substance Prescriptions [place] Controlled Substance  Registry consulted? Not Applicable   Mickie Bail, NP 07/28/19 1106

## 2019-07-28 NOTE — ED Triage Notes (Signed)
Pt report having pain in the right heel for 3 days.

## 2019-07-28 NOTE — Discharge Instructions (Signed)
Take ibuprofen as prescribed as needed.    Wear the Ace wrap and use the Ortho shoe as needed for comfort.    Rest and elevate your foot.  You can apply ice packs 2-3 times a day for up to 20 minutes.    Follow-up with the orthopedic listed if your pain continues.

## 2019-07-31 ENCOUNTER — Ambulatory Visit: Payer: BC Managed Care – PPO | Admitting: Orthopaedic Surgery

## 2019-09-24 ENCOUNTER — Ambulatory Visit (HOSPITAL_COMMUNITY)
Admission: EM | Admit: 2019-09-24 | Discharge: 2019-09-24 | Disposition: A | Discharge status: Home / Self Care | Payer: BC Managed Care – PPO | Attending: Family Medicine | Admitting: Family Medicine

## 2019-09-24 ENCOUNTER — Other Ambulatory Visit: Payer: Self-pay

## 2019-09-24 ENCOUNTER — Encounter (HOSPITAL_COMMUNITY): Payer: Self-pay | Admitting: Emergency Medicine

## 2019-09-24 DIAGNOSIS — N952 Postmenopausal atrophic vaginitis: Secondary | ICD-10-CM | POA: Insufficient documentation

## 2019-09-24 DIAGNOSIS — E890 Postprocedural hypothyroidism: Secondary | ICD-10-CM | POA: Diagnosis not present

## 2019-09-24 LAB — TSH: TSH: 41.023 u[IU]/mL — ABNORMAL HIGH (ref 0.350–4.500)

## 2019-09-24 MED ORDER — LEVOTHYROXINE SODIUM 150 MCG PO TABS: 150.0000 ug | ORAL_TABLET | Freq: Every day | ORAL | 2 refills | Status: AC

## 2019-09-24 MED ORDER — CLOBETASOL PROPIONATE 0.05 % EX OINT: 1.0000 "application " | TOPICAL_OINTMENT | Freq: Two times a day (BID) | CUTANEOUS | 1 refills | Status: AC

## 2019-09-24 NOTE — ED Provider Notes (Signed)
[facility]    CSN: [id number] Arrival date & time: 09/24/19  4010      History   Chief Complaint Chief Complaint  Patient presents with  . Vaginal Itching    HPI Alison Harris is a 63 y.o. female.   HPI  Here for vaginal itching.  It has been present for a "long time", at lease a couple of months.  No discharge.  Is not sexually active. Went for a pap smear last week and was told she had a yeast infection.  Was given one diflucan.  It did not help.  Tried OTC monistat.  No help. Otherwise is not under a doctors care.  She is retired.  Is not taking her synthroid for a  " long minute ".  Feels tired, has gained weight.  I explained the importance on staying on thyroid replacement.  Will get a TSH and refill her synthroid at this visit.  Past Medical History:  Diagnosis Date  . Arthritis   . Thyroid disease     Patient Active Problem List   Diagnosis Date Noted  . DENTAL PAIN 02/06/2008  . [place] DISEASE 08/01/2007  . HYPOTHYROIDISM 08/01/2007  . DYSLIPIDEMIA 08/01/2007  . 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 ALCOHOL USE 08/01/2007  . ECZEMA 08/01/2007  . ASCUS PAP 08/01/2007  . EXOPHTHALMOS 07/18/2004    Past Surgical History:  Procedure Laterality Date  . ABDOMINAL HYSTERECTOMY      OB History   No obstetric history on file.      Home Medications    Prior to Admission medications   Medication Sig Start Date End Date Taking? Authorizing Provider  clobetasol ointment (TEMOVATE) 2.72 % Apply 1 application topically 2 (two) times daily. 09/24/19   Raylene Everts, MD  ibuprofen (ADVIL) 800 MG tablet Take 1 tablet (800 mg total) by mouth every 8 (eight) hours as needed. 07/28/19   Sharion Balloon, NP  levothyroxine (SYNTHROID) 150 MCG tablet Take 1 tablet (150 mcg total) by mouth daily before breakfast. 09/24/19   Raylene Everts, MD    Family History Family History  Problem Relation Age of Onset  . Bronchitis Mother     Social History Social History    Tobacco Use  . Smoking status: Never Smoker  . Smokeless tobacco: Never Used  Substance Use Topics  . Alcohol use: Yes  . Drug use: Not on file     Allergies   Patient has no known allergies.   Review of Systems Review of Systems  Constitutional: Positive for fatigue and unexpected weight change. Negative for chills and fever.  HENT: Negative for ear pain and sore throat.   Eyes: Negative for pain and visual disturbance.  Respiratory: Negative for cough and shortness of breath.   Cardiovascular: Negative for chest pain and palpitations.  Gastrointestinal: Negative for abdominal pain and vomiting.  Genitourinary: Negative for dysuria, hematuria, menstrual problem, vaginal bleeding, vaginal discharge and vaginal pain.       Vaginal itching  Musculoskeletal: Negative for arthralgias and back pain.  Skin: Negative for color change and rash.  Neurological: Negative for seizures and syncope.  All other systems reviewed and are negative.    Physical Exam Triage Vital Signs ED Triage Vitals  Enc Vitals Group     BP 09/24/19 0900 125/67     Pulse Rate 09/24/19 0900 75     Resp 09/24/19 0900 18     Temp 09/24/19 0900 97.8 F (36.6 C)  Temp Source 09/24/19 0900 Temporal     SpO2 09/24/19 0900 100 %     Weight --      Height --      Head Circumference --      Peak Flow --      Pain Score 09/24/19 0904 6     Pain Loc --      Pain Edu? --      Excl. in GC? --    No data found.  Updated Vital Signs BP 125/67 (BP Location: Left Arm)   Pulse 75   Temp 97.8 F (36.6 C) (Temporal)   Resp 18   SpO2 100%      Physical Exam Constitutional:      General: She is not in acute distress.    Appearance: She is well-developed.     Comments: Overweight.  Exopthalmos  HENT:     Head: Normocephalic and atraumatic.  Eyes:     Conjunctiva/sclera: Conjunctivae normal.     Pupils: Pupils are equal, round, and reactive to light.  Neck:     Musculoskeletal: Normal range of  motion.  Cardiovascular:     Rate and Rhythm: Normal rate.  Pulmonary:     Effort: Pulmonary effort is normal. No respiratory distress.  Abdominal:     General: There is no distension.     Palpations: Abdomen is soft.  Genitourinary:    Comments: Ext genitalia examined - no discharge or inflammation.  She has thinning of skin Musculoskeletal: Normal range of motion.  Skin:    General: Skin is warm and dry.  Neurological:     Mental Status: She is alert.      UC Treatments / Results  Labs (all labs ordered are listed, but only abnormal results are displayed) Labs Reviewed  TSH    EKG   Radiology No results found.  Procedures Procedures (including critical care time)  Medications Ordered in UC Medications - No data to display  Initial Impression / Assessment and Plan / UC Course  I have reviewed the triage vital signs and the nursing notes.  Pertinent labs & imaging results that were available during my care of the patient were reviewed by me and considered in my medical decision making (see chart for details).     Referred to PCP for care Discussed atrophic vaginitis Final Clinical Impressions(s) / UC Diagnoses   Final diagnoses:  Post-menopausal atrophic vaginitis  Postablative hypothyroidism     Discharge Instructions     Use mild soaps Use the ointment 2 x a day for 2 weeks, then as needed Take the thyroid every morning You should get a thyroid blood test in 6-8 weeks Recommend seeing a PCP for ongoing care   ED Prescriptions    Medication Sig Dispense Auth. Provider   clobetasol ointment (TEMOVATE) 0.05 % Apply 1 application topically 2 (two) times daily. 30 g Eustace Moore, MD   levothyroxine (SYNTHROID) 150 MCG tablet Take 1 tablet (150 mcg total) by mouth daily before breakfast. 30 tablet Eustace Moore, MD     PDMP not reviewed this encounter.   Eustace Moore, MD 09/24/19 1023

## 2019-09-24 NOTE — Discharge Instructions (Signed)
Use mild soaps Use the ointment 2 x a day for 2 weeks, then as needed Take the thyroid every morning You should get a thyroid blood test in 6-8 weeks Recommend seeing a PCP for ongoing care

## 2019-09-24 NOTE — ED Triage Notes (Signed)
Vaginal itching for a month.  Patient has been seen for this and has had std evaluation at clinic-negative for std's per patient and was given a pill for yeast infection.  Patient reports no improvement with medication.  Continues to be sore and itching.  Patient has changed soaps to "sensitive" body wash

## 2019-09-29 ENCOUNTER — Telehealth: Payer: Self-pay | Admitting: Emergency Medicine

## 2019-09-29 NOTE — Telephone Encounter (Signed)
Contacted patient about her elevated thyroid level, she was not currently taking medication. Informed her she needs to continue to take her medicine as directed and to follow up with PCP for recheck of levels to make sure they are back to normal. Pt verbalized understanding, all questions answered.

## 2020-07-15 ENCOUNTER — Other Ambulatory Visit: Payer: Self-pay | Admitting: Obstetrics & Gynecology

## 2021-03-04 ENCOUNTER — Other Ambulatory Visit: Payer: Self-pay | Admitting: Physician Assistant

## 2021-03-04 DIAGNOSIS — E0500 Thyrotoxicosis with diffuse goiter without thyrotoxic crisis or storm: Secondary | ICD-10-CM

## 2021-03-17 ENCOUNTER — Other Ambulatory Visit: Payer: BC Managed Care – PPO

## 2022-05-01 ENCOUNTER — Other Ambulatory Visit: Payer: Self-pay | Admitting: Physician Assistant

## 2022-05-01 DIAGNOSIS — Z1231 Encounter for screening mammogram for malignant neoplasm of breast: Secondary | ICD-10-CM

## 2022-05-04 ENCOUNTER — Other Ambulatory Visit: Payer: Self-pay | Admitting: Physician Assistant

## 2022-05-04 DIAGNOSIS — R5381 Other malaise: Secondary | ICD-10-CM

## 2022-05-11 ENCOUNTER — Ambulatory Visit: Payer: Self-pay

## 2022-05-12 ENCOUNTER — Ambulatory Visit
Admission: RE | Admit: 2022-05-12 | Discharge: 2022-05-12 | Disposition: A | Discharge status: Home / Self Care | Payer: Medicare Other | Source: Ambulatory Visit | Attending: Physician Assistant | Admitting: Physician Assistant

## 2022-05-12 DIAGNOSIS — Z1231 Encounter for screening mammogram for malignant neoplasm of breast: Secondary | ICD-10-CM

## 2022-06-06 ENCOUNTER — Other Ambulatory Visit: Payer: Self-pay | Admitting: Physician Assistant

## 2022-06-06 DIAGNOSIS — Z1231 Encounter for screening mammogram for malignant neoplasm of breast: Secondary | ICD-10-CM

## 2022-06-07 ENCOUNTER — Other Ambulatory Visit: Payer: Self-pay | Admitting: Physician Assistant

## 2022-06-07 DIAGNOSIS — Z13820 Encounter for screening for osteoporosis: Secondary | ICD-10-CM

## 2022-07-13 ENCOUNTER — Other Ambulatory Visit: Payer: Medicare Other

## 2023-06-15 ENCOUNTER — Other Ambulatory Visit: Payer: Self-pay | Admitting: Physician Assistant

## 2023-06-15 DIAGNOSIS — Z1231 Encounter for screening mammogram for malignant neoplasm of breast: Secondary | ICD-10-CM

## 2023-06-21 ENCOUNTER — Ambulatory Visit
Admission: RE | Admit: 2023-06-21 | Discharge: 2023-06-21 | Disposition: A | Discharge status: Home / Self Care | Payer: Medicare Other | Source: Ambulatory Visit | Attending: Physician Assistant | Admitting: Physician Assistant

## 2023-06-21 DIAGNOSIS — Z1231 Encounter for screening mammogram for malignant neoplasm of breast: Secondary | ICD-10-CM

## 2023-06-25 ENCOUNTER — Other Ambulatory Visit: Payer: Self-pay | Admitting: Physician Assistant

## 2023-06-25 DIAGNOSIS — R928 Other abnormal and inconclusive findings on diagnostic imaging of breast: Secondary | ICD-10-CM

## 2023-07-05 ENCOUNTER — Ambulatory Visit: Admission: RE | Admit: 2023-07-05 | Payer: Medicare Other | Source: Ambulatory Visit

## 2023-07-05 ENCOUNTER — Ambulatory Visit
Admission: RE | Admit: 2023-07-05 | Discharge: 2023-07-05 | Disposition: A | Discharge status: Home / Self Care | Payer: Medicare Other | Source: Ambulatory Visit | Attending: Physician Assistant | Admitting: Physician Assistant

## 2023-07-05 DIAGNOSIS — R928 Other abnormal and inconclusive findings on diagnostic imaging of breast: Secondary | ICD-10-CM

## 2023-12-26 ENCOUNTER — Ambulatory Visit
Admission: RE | Admit: 2023-12-26 | Discharge: 2023-12-26 | Disposition: A | Discharge status: Home / Self Care | Payer: Medicare Other | Source: Ambulatory Visit | Attending: Physician Assistant | Admitting: Physician Assistant

## 2023-12-26 ENCOUNTER — Other Ambulatory Visit: Payer: Self-pay | Admitting: Physician Assistant

## 2023-12-26 DIAGNOSIS — M79645 Pain in left finger(s): Secondary | ICD-10-CM

## 2024-06-30 ENCOUNTER — Other Ambulatory Visit: Payer: Self-pay | Admitting: Physician Assistant

## 2024-06-30 DIAGNOSIS — Z1231 Encounter for screening mammogram for malignant neoplasm of breast: Secondary | ICD-10-CM

## 2024-07-17 ENCOUNTER — Ambulatory Visit
Admission: RE | Admit: 2024-07-17 | Discharge: 2024-07-17 | Disposition: A | Discharge status: Home / Self Care | Source: Ambulatory Visit | Attending: Physician Assistant | Admitting: Physician Assistant

## 2024-07-17 DIAGNOSIS — Z1231 Encounter for screening mammogram for malignant neoplasm of breast: Secondary | ICD-10-CM

## 2024-07-24 ENCOUNTER — Other Ambulatory Visit: Payer: Self-pay | Admitting: Physician Assistant

## 2024-07-24 DIAGNOSIS — R928 Other abnormal and inconclusive findings on diagnostic imaging of breast: Secondary | ICD-10-CM

## 2024-07-29 ENCOUNTER — Other Ambulatory Visit

## 2024-07-29 ENCOUNTER — Inpatient Hospital Stay
Admission: RE | Admit: 2024-07-29 | Discharge: 2024-07-29 | Discharge status: Home / Self Care | Source: Ambulatory Visit | Attending: Physician Assistant | Admitting: Physician Assistant

## 2024-07-29 ENCOUNTER — Other Ambulatory Visit: Payer: Self-pay | Admitting: Physician Assistant

## 2024-07-29 ENCOUNTER — Ambulatory Visit
Admission: RE | Admit: 2024-07-29 | Discharge: 2024-07-29 | Disposition: A | Discharge status: Home / Self Care | Source: Ambulatory Visit | Attending: Physician Assistant | Admitting: Physician Assistant

## 2024-07-29 DIAGNOSIS — R928 Other abnormal and inconclusive findings on diagnostic imaging of breast: Secondary | ICD-10-CM

## 2024-07-29 DIAGNOSIS — N6489 Other specified disorders of breast: Secondary | ICD-10-CM

## 2024-07-30 ENCOUNTER — Other Ambulatory Visit

## 2024-07-30 ENCOUNTER — Encounter

## 2024-07-31 ENCOUNTER — Ambulatory Visit
Admission: RE | Admit: 2024-07-31 | Discharge: 2024-07-31 | Disposition: A | Discharge status: Home / Self Care | Source: Ambulatory Visit | Attending: Physician Assistant | Admitting: Physician Assistant

## 2024-07-31 DIAGNOSIS — N6489 Other specified disorders of breast: Secondary | ICD-10-CM

## 2024-07-31 DIAGNOSIS — R928 Other abnormal and inconclusive findings on diagnostic imaging of breast: Secondary | ICD-10-CM

## 2024-07-31 HISTORY — PX: BREAST BIOPSY: SHX20

## 2024-08-04 LAB — SURGICAL PATHOLOGY
